# Patient Record
Sex: Male | Born: 1975 | Race: Asian | Hispanic: No | Marital: Single | Smoking: Former smoker
Health system: Southern US, Community
[De-identification: ages and names within clinical notes are randomized; demographics above are authoritative.]

---

## 2015-09-03 ENCOUNTER — Encounter (HOSPITAL_COMMUNITY): Payer: Self-pay | Admitting: *Deleted

## 2015-09-03 ENCOUNTER — Emergency Department (HOSPITAL_COMMUNITY): Payer: BLUE CROSS/BLUE SHIELD

## 2015-09-03 ENCOUNTER — Emergency Department (HOSPITAL_COMMUNITY)
Admission: EM | Admit: 2015-09-03 | Discharge: 2015-09-03 | Disposition: A | Discharge status: Home / Self Care | Payer: BLUE CROSS/BLUE SHIELD | Attending: Emergency Medicine | Admitting: Emergency Medicine

## 2015-09-03 DIAGNOSIS — J069 Acute upper respiratory infection, unspecified: Secondary | ICD-10-CM | POA: Insufficient documentation

## 2015-09-03 DIAGNOSIS — Z87891 Personal history of nicotine dependence: Secondary | ICD-10-CM | POA: Insufficient documentation

## 2015-09-03 DIAGNOSIS — R0602 Shortness of breath: Secondary | ICD-10-CM | POA: Diagnosis present

## 2015-09-03 MED ORDER — PREDNISONE 10 MG PO TABS: 20.0000 mg | ORAL_TABLET | Freq: Every day | ORAL | Status: DC

## 2015-09-03 MED ORDER — ALBUTEROL SULFATE HFA 108 (90 BASE) MCG/ACT IN AERS: 1.0000 | INHALATION_SPRAY | Freq: Four times a day (QID) | RESPIRATORY_TRACT | Status: AC | PRN

## 2015-09-03 MED ORDER — AZITHROMYCIN 250 MG PO TABS: 250.0000 mg | ORAL_TABLET | Freq: Every day | ORAL | Status: AC

## 2015-09-03 MED ORDER — ALBUTEROL SULFATE (2.5 MG/3ML) 0.083% IN NEBU
INHALATION_SOLUTION | RESPIRATORY_TRACT | Status: AC
  Filled 2015-09-03: qty 6

## 2015-09-03 MED ORDER — ALBUTEROL SULFATE (2.5 MG/3ML) 0.083% IN NEBU
5.0000 mg | INHALATION_SOLUTION | Freq: Once | RESPIRATORY_TRACT | Status: AC
  Administered 2015-09-03: 5 mg via RESPIRATORY_TRACT

## 2015-09-03 NOTE — ED Provider Notes (Signed)
CSN: [id number]     Arrival date & time 09/03/15  1137 [id number]   First MD Initiated Contact with Patient 09/03/15 1239     Chief Complaint  Patient presents with  . Cough  . Shortness of Breath   HPI   Oscar Raymond is a 39 y.o. M presenting with a one week history of SOB, productive cough (worse at night), and not feeling well. His wife had a baby 2 weeks ago and they have been visiting in the NICU and He denies fevers, chills, CP, hemoptysis, abdominal pain, N/V/D. He admits to smoking.    History reviewed. No pertinent past medical history. History reviewed. No pertinent past surgical history. History reviewed. No pertinent family history. Social History  Substance Use Topics  . Smoking status: Former Games developermoker  . Smokeless tobacco: None  . Alcohol Use: No    Review of Systems  Ten systems are reviewed and are negative for acute change except as noted in the HPI   Allergies  Review of patient's allergies indicates no known allergies.  Home Medications   Prior to Admission medications   Not on File   BP 175/108 mmHg  Pulse 123  Temp(Src) 98.5 F (36.9 C) (Oral)  Resp 26  Ht 5\' 10"  (1.778 m)  Wt 172 lb (78.019 kg)  BMI 24.68 kg/m2  SpO2 94% Physical Exam  Constitutional: He appears well-developed and well-nourished. No distress.  HENT:  Head: Normocephalic and atraumatic.  Right Ear: External ear normal.  Left Ear: External ear normal.  Nose: Nose normal.  Mouth/Throat: Oropharynx is clear and moist. No oropharyngeal exudate.  Eyes: Conjunctivae are normal. Pupils are equal, round, and reactive to light. Right eye exhibits no discharge. Left eye exhibits no discharge. No scleral icterus.  Neck: No tracheal deviation present.  Cardiovascular: Normal rate, regular rhythm, normal heart sounds and intact distal pulses.  Exam reveals no gallop and no friction rub.   No murmur heard. Pulmonary/Chest: Effort normal. No respiratory distress. He has wheezes. He has no rales.  He exhibits no tenderness.  Expiratory wheezing present bilaterally.   Abdominal: Soft. Bowel sounds are normal. He exhibits no distension and no mass. There is no tenderness. There is no rebound and no guarding.  Musculoskeletal: He exhibits no edema.  Lymphadenopathy:    He has no cervical adenopathy.  Neurological: He is alert. Coordination normal.  Skin: Skin is warm and dry. No rash noted. He is not diaphoretic. No erythema.  Psychiatric: He has a normal mood and affect. His behavior is normal.  Nursing note and vitals reviewed.   ED Course  Procedures   Imaging Review Dg Chest 2 View  09/03/2015  CLINICAL DATA:  Cough and shortness of breath for 2 weeks EXAM: CHEST - 2 VIEW COMPARISON:  None. FINDINGS: Cardiac shadow is within normal limits. The lungs are well aerated bilaterally. No focal infiltrate or sizable effusion is seen. No bony abnormality is noted. IMPRESSION: No active disease. Electronically Signed   By: Alcide CleverMark  Lukens M.D.   On: 09/03/2015 13:01   I have personally reviewed and evaluated these images and lab results as part of my medical decision-making.  MDM   Final diagnoses:  Upper respiratory infection   Patient non-toxic appearing; however, patient tachycardic so Dr. Judd Lienelo will see patient as well.   CXR negative, EKG shows sinus tachycardia. Do not feel this is PE, cardiac/vasculature etiology. Most likely URI.   Patient may be safely discharged home with z-pack and steroids. Discussed reasons  for return. Patient to follow-up with primary care provider within one week. Patient in understanding and agreement with the plan.   Melton Krebs, PA-C 09/06/15 2036  Geoffery Lyons, MD 09/08/15 [phone]

## 2015-09-03 NOTE — Discharge Instructions (Signed)
Mr. Oscar Raymond,  Nice meeting you! Please take your antibiotics as prescribed. Return to the emergency department if you develop chest pain, fevers, increasing shortness of breath, or if you do not improve within a few days. Please follow-up with a primary care provider. Feel better soon!  S. Lane HackerNicole Hargis Vandyne, PA-C

## 2015-09-03 NOTE — ED Notes (Signed)
Pt reports not feeling well for last week and thought he had cold or flu, was taking otc meds. Reports non productive cough that would get worse at night. This am feeling very sob, wheezing noted, denies hx of asthma.

## 2016-06-18 IMAGING — CR DG CHEST 2V
2 series · 2 of 2 positions shown · non-contrast
Comparison: None.

CLINICAL DATA: Cough and shortness of breath for 2 weeks

EXAM:
CHEST - 2 VIEW

[chest pa]
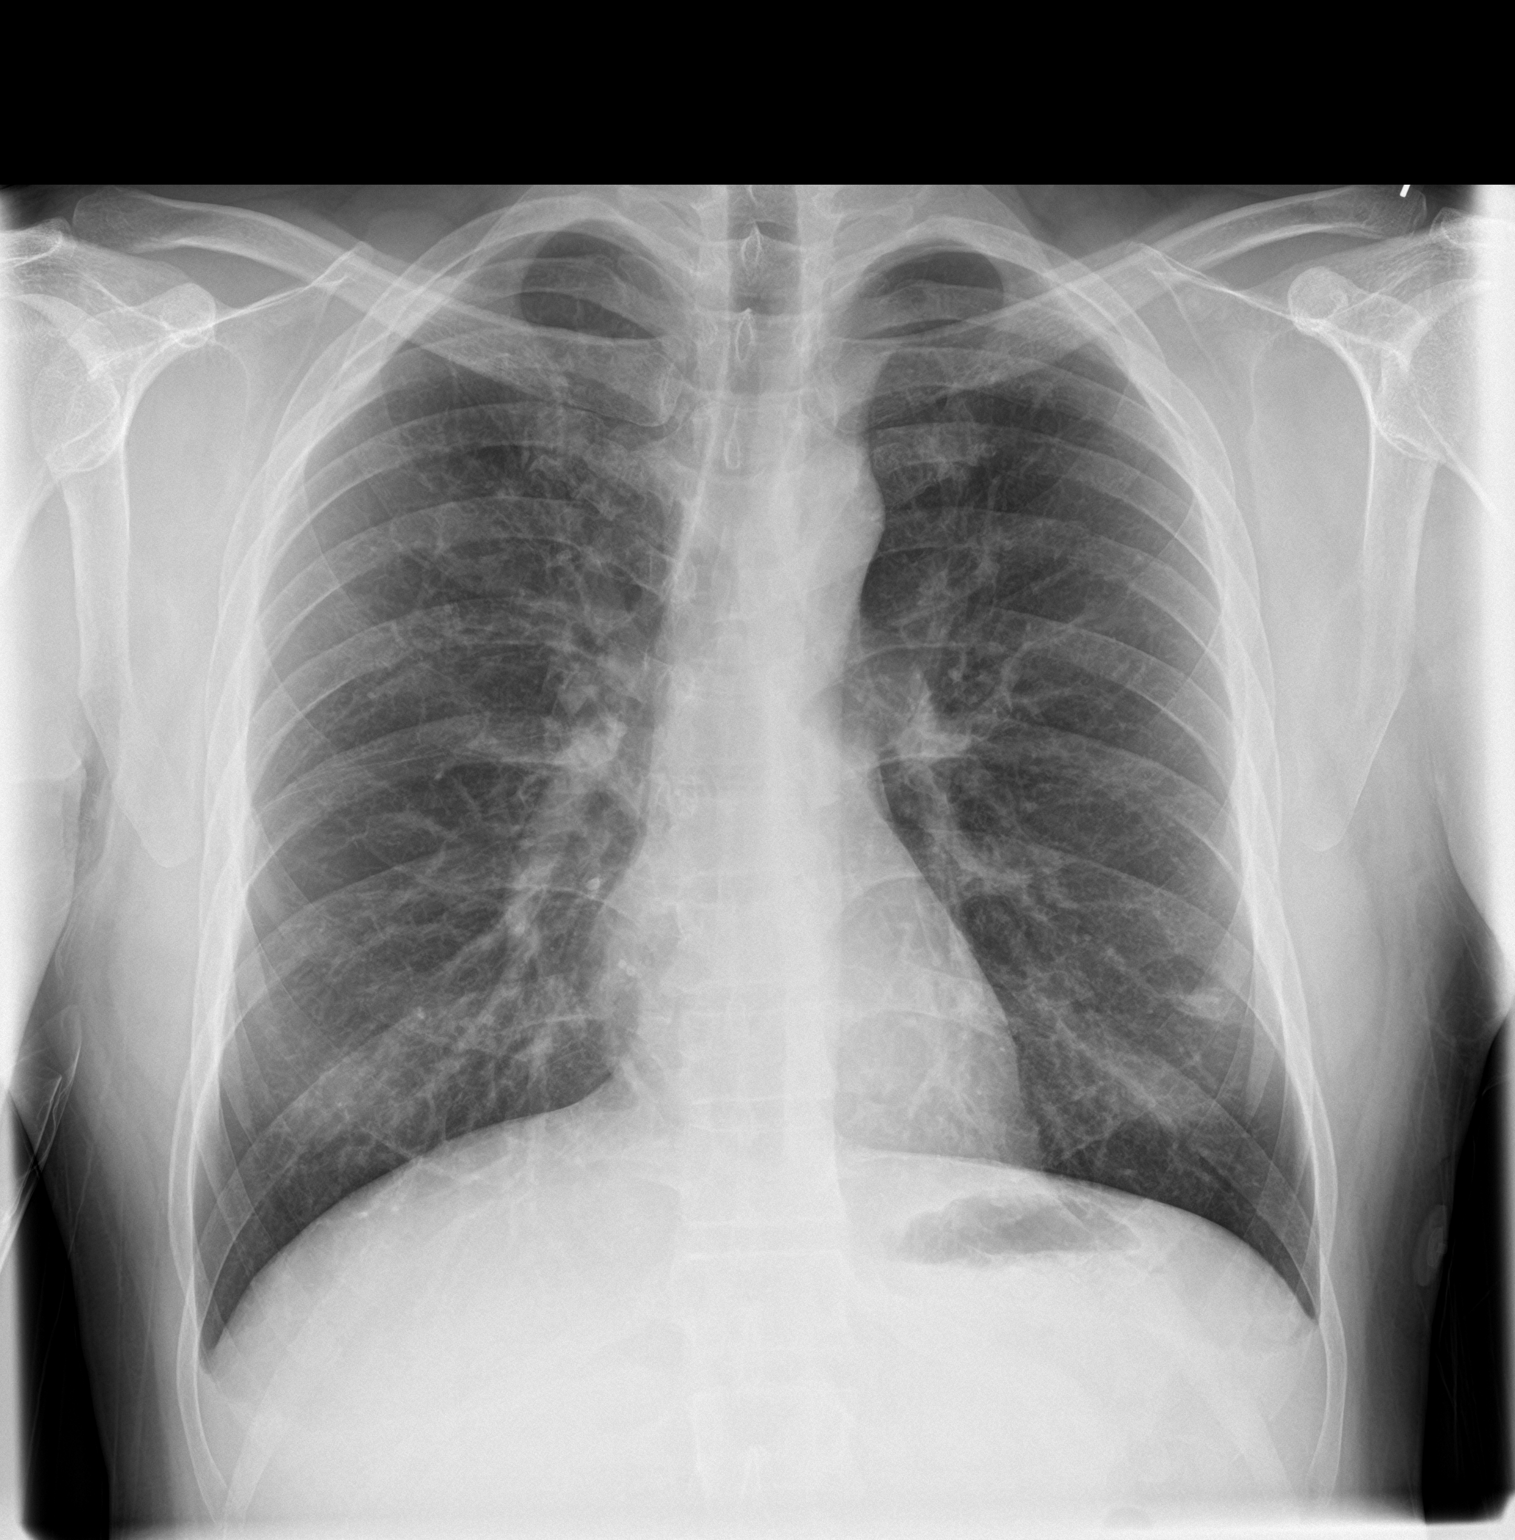

[chest lat]
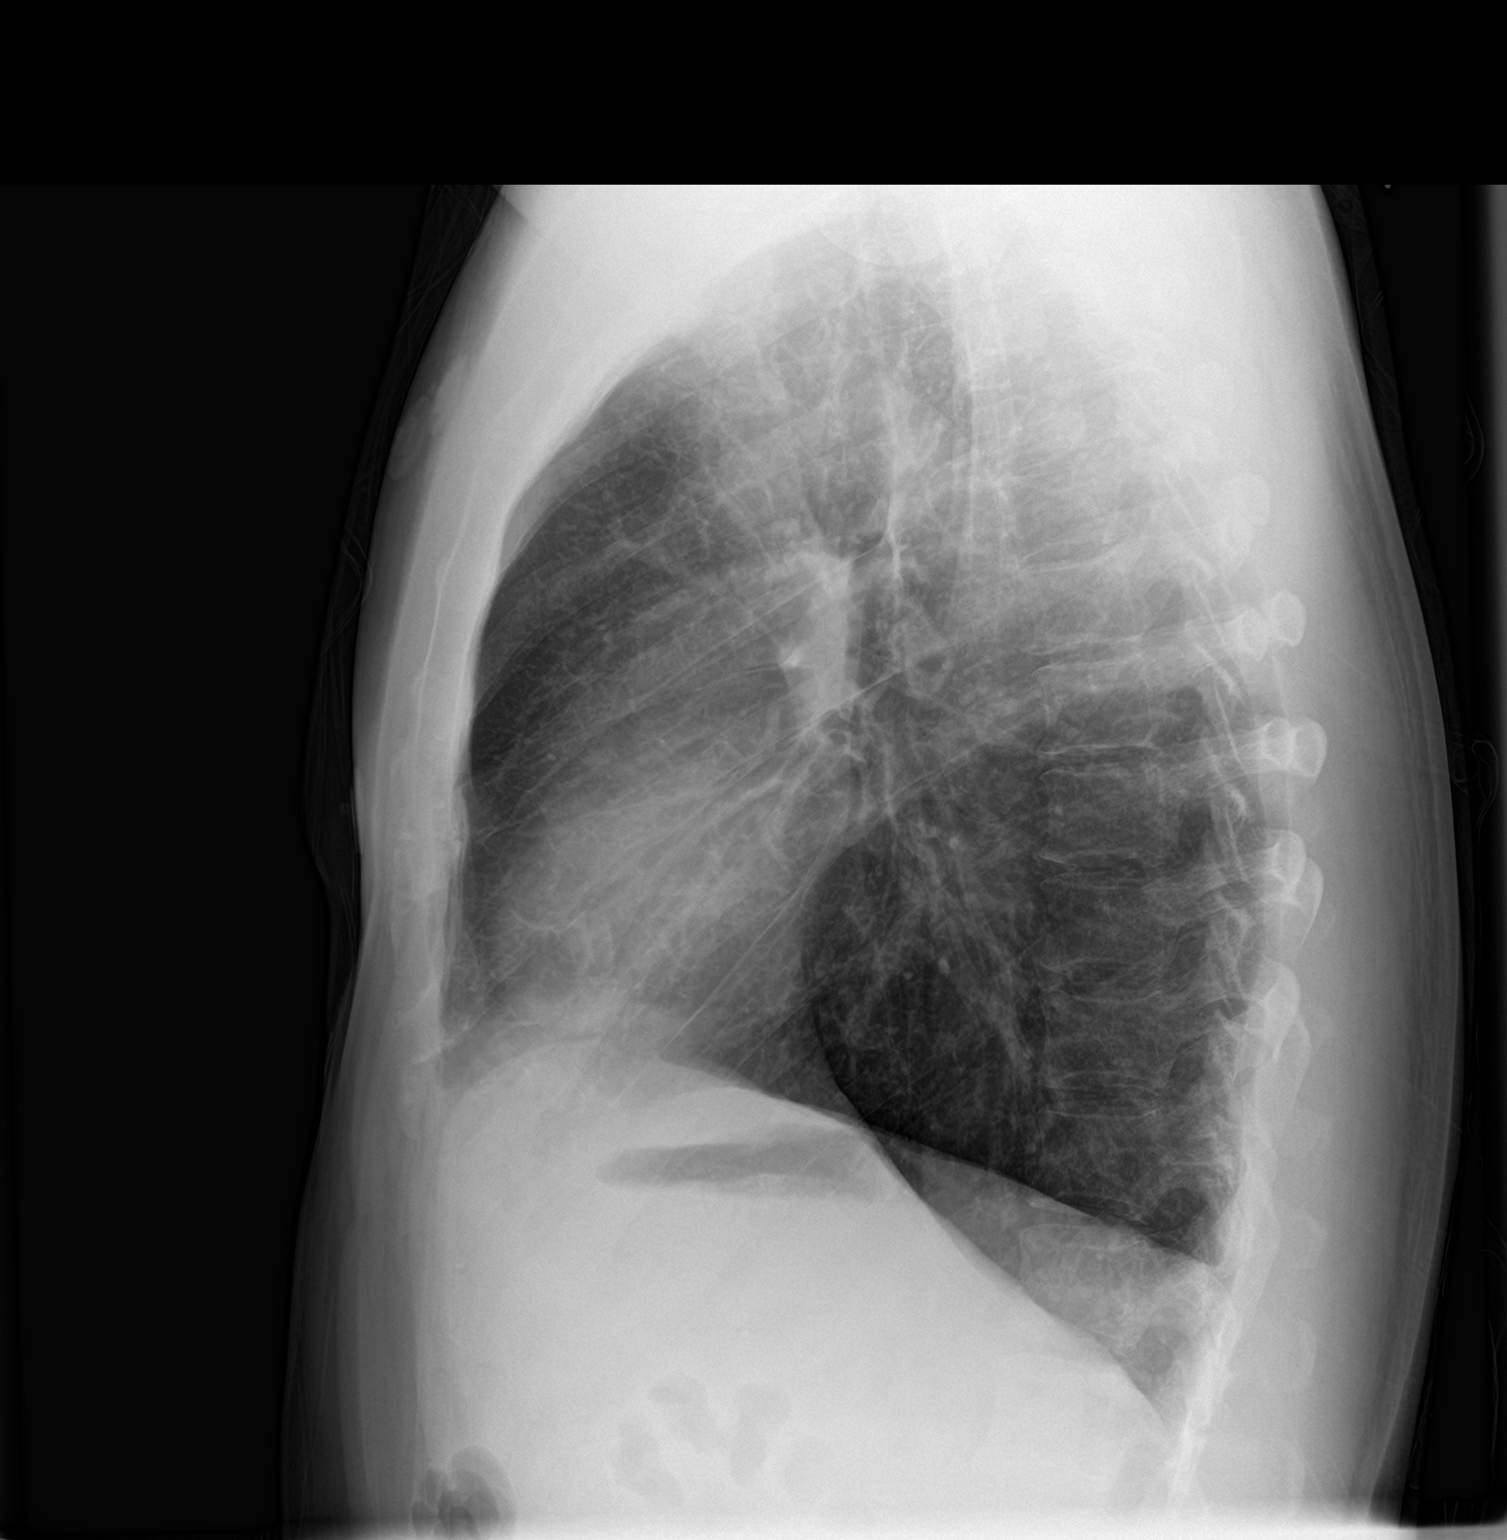

[2 of 2 positions shown; findings below may reference images not displayed]

FINDINGS: Cardiac shadow is within normal limits. The lungs are well aerated
bilaterally. No focal infiltrate or sizable effusion is seen. No
bony abnormality is noted.
IMPRESSION: No active disease.

## 2017-01-30 ENCOUNTER — Encounter (HOSPITAL_COMMUNITY): Payer: Self-pay | Admitting: Emergency Medicine

## 2017-01-30 ENCOUNTER — Emergency Department (HOSPITAL_COMMUNITY)
Admission: EM | Admit: 2017-01-30 | Discharge: 2017-01-30 | Disposition: A | Discharge status: Home / Self Care | Payer: BLUE CROSS/BLUE SHIELD | Attending: Emergency Medicine | Admitting: Emergency Medicine

## 2017-01-30 DIAGNOSIS — M545 Low back pain: Secondary | ICD-10-CM | POA: Diagnosis present

## 2017-01-30 DIAGNOSIS — M5431 Sciatica, right side: Secondary | ICD-10-CM | POA: Diagnosis not present

## 2017-01-30 DIAGNOSIS — Z79899 Other long term (current) drug therapy: Secondary | ICD-10-CM | POA: Diagnosis not present

## 2017-01-30 DIAGNOSIS — Z87891 Personal history of nicotine dependence: Secondary | ICD-10-CM | POA: Insufficient documentation

## 2017-01-30 MED ORDER — CYCLOBENZAPRINE HCL 10 MG PO TABS: 10.0000 mg | ORAL_TABLET | Freq: Two times a day (BID) | ORAL | 0 refills | Status: AC | PRN

## 2017-01-30 MED ORDER — TRAMADOL HCL 50 MG PO TABS
50.0000 mg | ORAL_TABLET | Freq: Four times a day (QID) | ORAL | 0 refills | Status: AC | PRN
Start: 2017-01-30 — End: ?

## 2017-01-30 MED ORDER — PREDNISONE 20 MG PO TABS: 40.0000 mg | ORAL_TABLET | Freq: Every day | ORAL | 0 refills | Status: AC

## 2017-01-30 NOTE — ED Provider Notes (Signed)
[facility]-EMERGENCY DEPT Provider Note   CSN: [id number] Arrival date & time: 01/30/17  1157 By signing my name below, I, Bridgette Habermann, attest that this documentation has been prepared under the direction and in the presence of [employer], PA-C. Electronically Signed: Bridgette Habermann, ED Scribe. 01/30/17. 12:49 PM.  History   Chief Complaint Chief Complaint  Patient presents with  . Back Pain    HPI The history is provided by the patient. No language interpreter was used.   HPI Comments: Oscar Raymond is a 41 y.o. male with no pertinent PMHx, who presents to the Emergency Department complaining of right lower back pain radiating down to right hip and right leg beginning this morning. He also has intermittent right leg paresthesias. Pt denies any recent injury or trauma. Pain is exacerbated with deep breathing and any movement and improved with standing. He has taken Bayer back pain relief with mild relief. Reports normal bowel movement. Pt denies fever, chills, abdominal pain, or any other associated symptoms.   History reviewed. No pertinent past medical history.  There are no active problems to display for this patient.  History reviewed. No pertinent surgical history.   Home Medications    Prior to Admission medications   Medication Sig Start Date End Date Taking? Authorizing Provider  albuterol (PROVENTIL HFA;VENTOLIN HFA) 108 (90 BASE) MCG/ACT inhaler Inhale 1-2 puffs into the lungs every 6 (six) hours as needed for wheezing or shortness of breath. 09/03/15   Melton Krebs, PA-C  azithromycin (ZITHROMAX) 250 MG tablet Take 1 tablet (250 mg total) by mouth daily. Take first 2 tablets together, then 1 every day until finished. 09/03/15   Melton Krebs, PA-C  cyclobenzaprine (FLEXERIL) 10 MG tablet Take 1 tablet (10 mg total) by mouth 2 (two) times daily as needed for muscle spasms. 01/30/17   Eyvonne Mechanic, PA-C  predniSONE (DELTASONE) 20 MG tablet Take 2 tablets (40 mg total)  by mouth daily. 01/30/17   Eyvonne Mechanic, PA-C  traMADol (ULTRAM) 50 MG tablet Take 1 tablet (50 mg total) by mouth every 6 (six) hours as needed. 01/30/17   Eyvonne Mechanic, PA-C    Family History No family history on file.  Social History Social History  Substance Use Topics  . Smoking status: Former Games developer  . Smokeless tobacco: Not on file  . Alcohol use No     Allergies   Patient has no known allergies.   Review of Systems Review of Systems  Constitutional: Negative for chills and fever.  Gastrointestinal: Negative for abdominal pain.  Musculoskeletal: Positive for arthralgias, back pain and myalgias.  All other systems reviewed and are negative.  Physical Exam Updated Vital Signs BP (!) 143/100 (BP Location: Right Arm)   Pulse 85   Temp 98.3 F (36.8 C) (Oral)   Resp 18   SpO2 100%   Physical Exam  Constitutional: He appears well-developed and well-nourished.  HENT:  Head: Normocephalic.  Eyes: Conjunctivae are normal.  Cardiovascular: Normal rate.   Pulmonary/Chest: Effort normal. No respiratory distress.  Abdominal: Soft. Bowel sounds are normal. He exhibits no distension. There is no tenderness. There is no rebound and no guarding.  Musculoskeletal: Normal range of motion. He exhibits no tenderness.  No C, T, or L-spine tenderness to palpation. No warmth or signs of infection. Decreased sensation along the lateral aspect of the lower extremity. Medial aspect is normal. Straight leg positive.  Neurological: He is alert.  Skin: Skin is warm and dry.  Psychiatric: He has a  normal mood and affect. His behavior is normal.  Nursing note and vitals reviewed.  ED Treatments / Results  DIAGNOSTIC STUDIES: Oxygen Saturation is 100% on RA, normal by my interpretation.   COORDINATION OF CARE: 12:49 PM-Discussed next steps with pt. Pt verbalized understanding and is agreeable with the plan.   Labs (all labs ordered are listed, but only abnormal results are  displayed) Labs Reviewed - No data to display  EKG  EKG Interpretation None       Radiology No results found.  Procedures Procedures (including critical care time)  Medications Ordered in ED Medications - No data to display   Initial Impression / Assessment and Plan / ED Course  I have reviewed the triage vital signs and the nursing notes.  Pertinent labs & imaging results that were available during my care of the patient were reviewed by me and considered in my medical decision making (see chart for details).     Final Clinical Impressions(s) / ED Diagnoses   Final diagnoses:  Sciatica of right side     Discharge Meds: Ultram, Flexeril, prednisone  Assessment/Plan: 40 year old male presents today with back pain.  This is nontraumatic.  He has no red flags here today.  Patient does have pain radiating down his leg with some paresthesias on the lateral aspect.  Patient reports improvement with ambulation.  With such short duration of symptoms, no significant deficits a trial of steroids, muscle relaxers and pain medication will be started.  The patient and I had an extensive discussion of return precautions and follow-up information.  Patient verbalizes understanding and agreement to today's plan had no further questions or concerns at the time discharge  New Prescriptions New Prescriptions   CYCLOBENZAPRINE (FLEXERIL) 10 MG TABLET    Take 1 tablet (10 mg total) by mouth 2 (two) times daily as needed for muscle spasms.   PREDNISONE (DELTASONE) 20 MG TABLET    Take 2 tablets (40 mg total) by mouth daily.   TRAMADOL (ULTRAM) 50 MG TABLET    Take 1 tablet (50 mg total) by mouth every 6 (six) hours as needed.   I personally performed the services described in this documentation, which was scribed in my presence. The recorded information has been reviewed and is accurate.    Eyvonne Mechanic, PA-C 01/30/17 1331    Shaune Pollack, MD 01/31/17 1136

## 2017-01-30 NOTE — ED Triage Notes (Signed)
Pt verbalizes awoke with right back pain radiating down right leg; denies GU symptoms or injury.

## 2017-01-30 NOTE — Discharge Instructions (Signed)
Please read attached information. If you experience any new or worsening signs or symptoms please return to the emergency room for evaluation. Please follow-up with your primary care provider or specialist as discussed. Please use medication prescribed only as directed and discontinue taking if you have any concerning signs or symptoms.   °
